# Patient Record
Sex: Male | Born: 1965 | Race: Black or African American | Hispanic: No | Marital: Married
Health system: Southern US, Community
[De-identification: ages and names within clinical notes are randomized; demographics above are authoritative.]

## PROBLEM LIST (undated history)

## (undated) DIAGNOSIS — N200 Calculus of kidney: Secondary | ICD-10-CM

## (undated) DIAGNOSIS — E079 Disorder of thyroid, unspecified: Secondary | ICD-10-CM

## (undated) DIAGNOSIS — I10 Essential (primary) hypertension: Secondary | ICD-10-CM

## (undated) HISTORY — PX: THYROIDECTOMY, PARTIAL: SHX18

## (undated) HISTORY — PX: CORNEAL TRANSPLANT: SHX108

## (undated) HISTORY — PX: APPENDECTOMY: SHX54

---

## 2019-01-14 ENCOUNTER — Encounter (HOSPITAL_COMMUNITY): Payer: Self-pay

## 2019-01-14 ENCOUNTER — Emergency Department (HOSPITAL_COMMUNITY): Payer: No Typology Code available for payment source

## 2019-01-14 ENCOUNTER — Emergency Department (HOSPITAL_COMMUNITY)
Admission: EM | Admit: 2019-01-14 | Discharge: 2019-01-14 | Disposition: A | Discharge status: Home / Self Care | Payer: No Typology Code available for payment source | Attending: Emergency Medicine | Admitting: Emergency Medicine

## 2019-01-14 DIAGNOSIS — I10 Essential (primary) hypertension: Secondary | ICD-10-CM | POA: Diagnosis not present

## 2019-01-14 DIAGNOSIS — Y929 Unspecified place or not applicable: Secondary | ICD-10-CM | POA: Diagnosis not present

## 2019-01-14 DIAGNOSIS — Y9389 Activity, other specified: Secondary | ICD-10-CM | POA: Diagnosis not present

## 2019-01-14 DIAGNOSIS — Y999 Unspecified external cause status: Secondary | ICD-10-CM | POA: Insufficient documentation

## 2019-01-14 DIAGNOSIS — E041 Nontoxic single thyroid nodule: Secondary | ICD-10-CM | POA: Diagnosis not present

## 2019-01-14 DIAGNOSIS — S060X0A Concussion without loss of consciousness, initial encounter: Secondary | ICD-10-CM | POA: Diagnosis not present

## 2019-01-14 DIAGNOSIS — R51 Headache: Secondary | ICD-10-CM | POA: Diagnosis present

## 2019-01-14 DIAGNOSIS — M79644 Pain in right finger(s): Secondary | ICD-10-CM | POA: Insufficient documentation

## 2019-01-14 HISTORY — DX: Essential (primary) hypertension: I10

## 2019-01-14 HISTORY — DX: Calculus of kidney: N20.0

## 2019-01-14 HISTORY — DX: Disorder of thyroid, unspecified: E07.9

## 2019-01-14 LAB — I-STAT CREATININE, ED: Creatinine, Ser: 1.2 mg/dL (ref 0.61–1.24)

## 2019-01-14 MED ORDER — NAPROXEN 500 MG PO TABS: 500.0000 mg | ORAL_TABLET | Freq: Two times a day (BID) | ORAL | 0 refills | Status: AC | PRN

## 2019-01-14 MED ORDER — OXYCODONE HCL 5 MG PO TABS
5.0000 mg | ORAL_TABLET | Freq: Once | ORAL | Status: AC
  Administered 2019-01-14: 5 mg via ORAL
  Filled 2019-01-14: qty 1

## 2019-01-14 MED ORDER — IOPAMIDOL (ISOVUE-370) INJECTION 76%
75.0000 mL | Freq: Once | INTRAVENOUS | Status: AC | PRN
  Administered 2019-01-14: 75 mL via INTRAVENOUS

## 2019-01-14 MED ORDER — MECLIZINE HCL 25 MG PO TABS: 25.0000 mg | ORAL_TABLET | Freq: Three times a day (TID) | ORAL | 0 refills | Status: AC | PRN

## 2019-01-14 MED ORDER — HYDROCODONE-ACETAMINOPHEN 5-325 MG PO TABS: 1.0000 | ORAL_TABLET | Freq: Once | ORAL | Status: DC

## 2019-01-14 MED ORDER — MECLIZINE HCL 25 MG PO TABS
25.0000 mg | ORAL_TABLET | Freq: Once | ORAL | Status: AC
  Administered 2019-01-14: 25 mg via ORAL
  Filled 2019-01-14: qty 1
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
## 2019-01-14 NOTE — Discharge Instructions (Signed)
It was my pleasure taking care of you today!   Naproxen as needed for pain.  Meclizine as needed for dizziness.   Call your primary care doctor Monday morning to schedule a follow up appointment about the thyroid nodule we happened to find. You should also follow up with them if you are not starting to feel better in 3-4 days.   Return to ER for new or worsening symptoms, any additional concerns.

## 2019-01-14 NOTE — ED Notes (Signed)
Patient transported to CT 

## 2019-01-14 NOTE — ED Notes (Signed)
ED Provider at bedside. 
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
